# Patient Record
Sex: Female | Born: 1960 | Race: Black or African American | Hispanic: No | Marital: Married | Smoking: Never smoker
Health system: Southern US, Community
[De-identification: ages and names within clinical notes are randomized; demographics above are authoritative.]

---

## 2020-02-13 ENCOUNTER — Encounter (HOSPITAL_COMMUNITY): Payer: Self-pay

## 2020-02-13 ENCOUNTER — Other Ambulatory Visit: Payer: Self-pay

## 2020-02-13 ENCOUNTER — Emergency Department (HOSPITAL_COMMUNITY): Payer: BC Managed Care – PPO

## 2020-02-13 ENCOUNTER — Emergency Department (HOSPITAL_COMMUNITY)
Admission: EM | Admit: 2020-02-13 | Discharge: 2020-02-13 | Disposition: A | Discharge status: Home / Self Care | Payer: BC Managed Care – PPO | Attending: Emergency Medicine | Admitting: Emergency Medicine

## 2020-02-13 DIAGNOSIS — R059 Cough, unspecified: Secondary | ICD-10-CM | POA: Diagnosis not present

## 2020-02-13 DIAGNOSIS — Z20822 Contact with and (suspected) exposure to covid-19: Secondary | ICD-10-CM | POA: Diagnosis not present

## 2020-02-13 DIAGNOSIS — R52 Pain, unspecified: Secondary | ICD-10-CM

## 2020-02-13 LAB — RESPIRATORY PANEL BY RT PCR (FLU A&B, COVID)
Influenza A by PCR: NEGATIVE
Influenza B by PCR: NEGATIVE
SARS Coronavirus 2 by RT PCR: NEGATIVE

## 2020-02-13 MED ORDER — GUAIFENESIN 100 MG/5ML PO LIQD
100.0000 mg | ORAL | 0 refills | Status: AC | PRN
Start: 2020-02-13 — End: ?

## 2020-02-13 MED ORDER — BENZONATATE 100 MG PO CAPS: 100.0000 mg | ORAL_CAPSULE | Freq: Three times a day (TID) | ORAL | 0 refills | Status: AC

## 2020-02-13 NOTE — ED Triage Notes (Signed)
Pt presents with a cough for several days, increase cough last night that prevented her from sleeping.

## 2020-02-13 NOTE — ED Provider Notes (Signed)
[facility] EMERGENCY DEPARTMENT Provider Note   CSN: [id number] Arrival date & time: 02/13/20  1218     History Chief Complaint  Patient presents with  . Cough    Sophia Watts is a 59 y.o. female.  Patient presents the emergency department for evaluation of cough.  Patient states that she is from [place] and was admitted to the hospital several months ago with pneumonia.  At that time she quit smoking.  She has had a constant cough since that time, however this has worsened over the past 1 week.  Cough is dry and nonproductive.  She denies associated fevers, nasal congestion.  She has a scratchy itchy sensation in her throat.  No heartburn symptoms.  No chest pain or shortness of breath.  No nausea, vomiting, or abdominal pain.  She has been using throat lozenges with temporary relief.  She was having difficulty sleeping last night due to persistent coughing.  She has not tried other over-the-counter medications.       History reviewed. No pertinent past medical history.  There are no problems to display for this patient.   History reviewed. No pertinent surgical history.   OB History   No obstetric history on file.     History reviewed. No pertinent family history.  Social History   Tobacco Use  . Smoking status: Never Smoker  . Smokeless tobacco: Never Used  Substance Use Topics  . Alcohol use: Never  . Drug use: Never    Home Medications Prior to Admission medications   Medication Sig Start Date End Date Taking? Authorizing Provider  benzonatate (TESSALON) 100 MG capsule Take 1 capsule (100 mg total) by mouth every 8 (eight) hours. 02/13/20   Renne Crigler, PA-C  guaiFENesin (ROBITUSSIN) 100 MG/5ML liquid Take 5-10 mLs (100-200 mg total) by mouth every 4 (four) hours as needed for cough. 02/13/20   Renne Crigler, PA-C    Allergies    Patient has no known allergies.  Review of Systems   Review of Systems  Constitutional: Negative for  fever.  HENT: Negative for congestion, rhinorrhea and sore throat.   Eyes: Negative for redness.  Respiratory: Positive for cough. Negative for shortness of breath.   Cardiovascular: Negative for chest pain.  Gastrointestinal: Negative for abdominal pain, diarrhea, nausea and vomiting.  Genitourinary: Negative for dysuria, frequency, hematuria and urgency.  Musculoskeletal: Negative for myalgias.  Skin: Negative for rash.  Neurological: Negative for headaches.  Psychiatric/Behavioral: Positive for sleep disturbance.    Physical Exam Updated Vital Signs BP 113/72 (BP Location: Left Arm)   Pulse 81   Temp 98.5 F (36.9 C) (Oral)   Resp 16   SpO2 93%   Physical Exam Vitals and nursing note reviewed.  Constitutional:      General: She is not in acute distress.    Appearance: She is well-developed.  HENT:     Head: Normocephalic and atraumatic.     Right Ear: External ear normal.     Left Ear: External ear normal.     Nose: Nose normal.  Eyes:     Conjunctiva/sclera: Conjunctivae normal.  Cardiovascular:     Rate and Rhythm: Normal rate and regular rhythm.     Heart sounds: No murmur heard.   Pulmonary:     Effort: No respiratory distress.     Breath sounds: No wheezing, rhonchi or rales.     Comments: No coughing during exam. Abdominal:     Palpations: Abdomen is soft.  Tenderness: There is no abdominal tenderness. There is no guarding or rebound.  Musculoskeletal:     Cervical back: Normal range of motion and neck supple.     Right lower leg: No edema.     Left lower leg: No edema.  Skin:    General: Skin is warm and dry.     Findings: No rash.  Neurological:     General: No focal deficit present.     Mental Status: She is alert. Mental status is at baseline.     Motor: No weakness.  Psychiatric:        Mood and Affect: Mood normal.     ED Results / Procedures / Treatments   Labs (all labs ordered are listed, but only abnormal results are  displayed) Labs Reviewed  RESPIRATORY PANEL BY RT PCR (FLU A&B, COVID)    EKG None  Radiology DG Chest 1 View  Result Date: 02/13/2020 CLINICAL DATA:  Cough and chest pain for 1 day. EXAM: CHEST  1 VIEW COMPARISON:  None. FINDINGS: The cardiac silhouette, mediastinal and hilar contours are within normal limits. There is a partially calcified density noted in the left hilum which is likely a calcified lymph node. Correlation with any prior chest films would be helpful to document stability. If none are available I would recommend a follow-up two-view chest x-ray in 3-4 months to document stability. No infiltrates, edema or effusions. The bony thorax is intact. IMPRESSION: 1. Probable calcified left hilar lymph node. Correlation with any prior chest films would be helpful to document stability. If none are available, I would recommend follow-up two-view chest x-ray in 3-4 months. 2. No acute pulmonary findings. Electronically Signed   By: Rudie Meyer M.D.   On: 02/13/2020 13:40    Procedures Procedures (including critical care time)  Medications Ordered in ED Medications - No data to display  ED Course  I have reviewed the triage vital signs and the nursing notes.  Pertinent labs & imaging results that were available during my care of the patient were reviewed by me and considered in my medical decision making (see chart for details).  Patient seen and examined.  Chest x-ray ordered and reviewed.  Patient with calcified lymph node in the left lung.  I discussed this with the patient.  She states that she has been told in the past that her doctor said she had "a spot" on her lung but is not concerned about it.  I encouraged her to have this followed up by her doctor to ensure stability of this area.  She verbalizes understanding agrees with plan.  Low concern or suspicion for Covid, swab sent, given symptoms.  Patient will be given Robitussin and Tessalon to try to see if this helps  cough and allows her to sleep.  We discussed typical progression of cough after quitting smoking and that this should improve with time.  Vital signs reviewed and are as follows: BP 113/72 (BP Location: Left Arm)   Pulse 81   Temp 98.5 F (36.9 C) (Oral)   Resp 16   SpO2 93%   Encouraged primary care follow-up to evaluate for other causes of chronic cough.  Discussed with patient that she may try over-the-counter course of omeprazole if cough does not improve with suppressants.  Otherwise encouraged return to the emergency department if she develops severe shortness of breath, trouble breathing, chest pain, fever, new symptoms or other concerns.    MDM Rules/Calculators/A&P  Patient with acute on chronic coughing, causing difficulty sleeping.  Chest x-ray today without acute findings.  Covid test pending.  Vital signs are reassuring.  Patient looks well and in no respiratory distress.  Will give treatment with cough suppressants and have her follow-up with her doctor upon returning home to [place].   Final Clinical Impression(s) / ED Diagnoses Final diagnoses:  Cough    Rx / DC Orders ED Discharge Orders         Ordered    benzonatate (TESSALON) 100 MG capsule  Every 8 hours        02/13/20 1407    guaiFENesin (ROBITUSSIN) 100 MG/5ML liquid  Every 4 hours PRN        02/13/20 1407           Renne Crigler, PA-C 02/13/20 1421    Arby Barrette, MD 02/16/20 1423

## 2020-02-13 NOTE — Discharge Instructions (Signed)
Please read and follow all provided instructions.  Your diagnoses today include:  1. Cough   2. Pain     Tests performed today include:  Chest x-ray - does not show pneumonia, shows a possible calcified lymph node in the left lung.  If you have had this in the past and it is stable, it is not likely to be a problem.  Please let your doctor know about this so you can have it rechecked.  COVID testing - pending, you may check your results in MyChart  Vital signs. See below for your results today.   Medications prescribed:   Tessalon Perles - cough suppressant medication  Take any prescribed medications only as directed.  Home care instructions:  Follow any educational materials contained in this packet.  BE VERY CAREFUL not to take multiple medicines containing Tylenol (also called acetaminophen). Doing so can lead to an overdose which can damage your liver and cause liver failure and possibly death.   Follow-up instructions: Please follow-up with your primary care provider in the next 3 days for further evaluation of your symptoms.   Return instructions:   Please return to the Emergency Department if you experience worsening symptoms.   Return with worsening shortness of breath or trouble breathing, fever, persistent vomiting or other concerns.  Please return if you have any other emergent concerns.  Additional Information:  Your vital signs today were: BP 113/72 (BP Location: Left Arm)   Pulse 81   Temp 98.5 F (36.9 C) (Oral)   Resp 16   SpO2 93%  If your blood pressure (BP) was elevated above 135/85 this visit, please have this repeated by your doctor within one month. --------------

## 2021-10-13 IMAGING — CR DG CHEST 1V
1 series · 1 of 1 positions shown · non-contrast
Comparison: None.

CLINICAL DATA: Cough and chest pain for 1 day.

EXAM:
CHEST  1 VIEW

[chest pa]
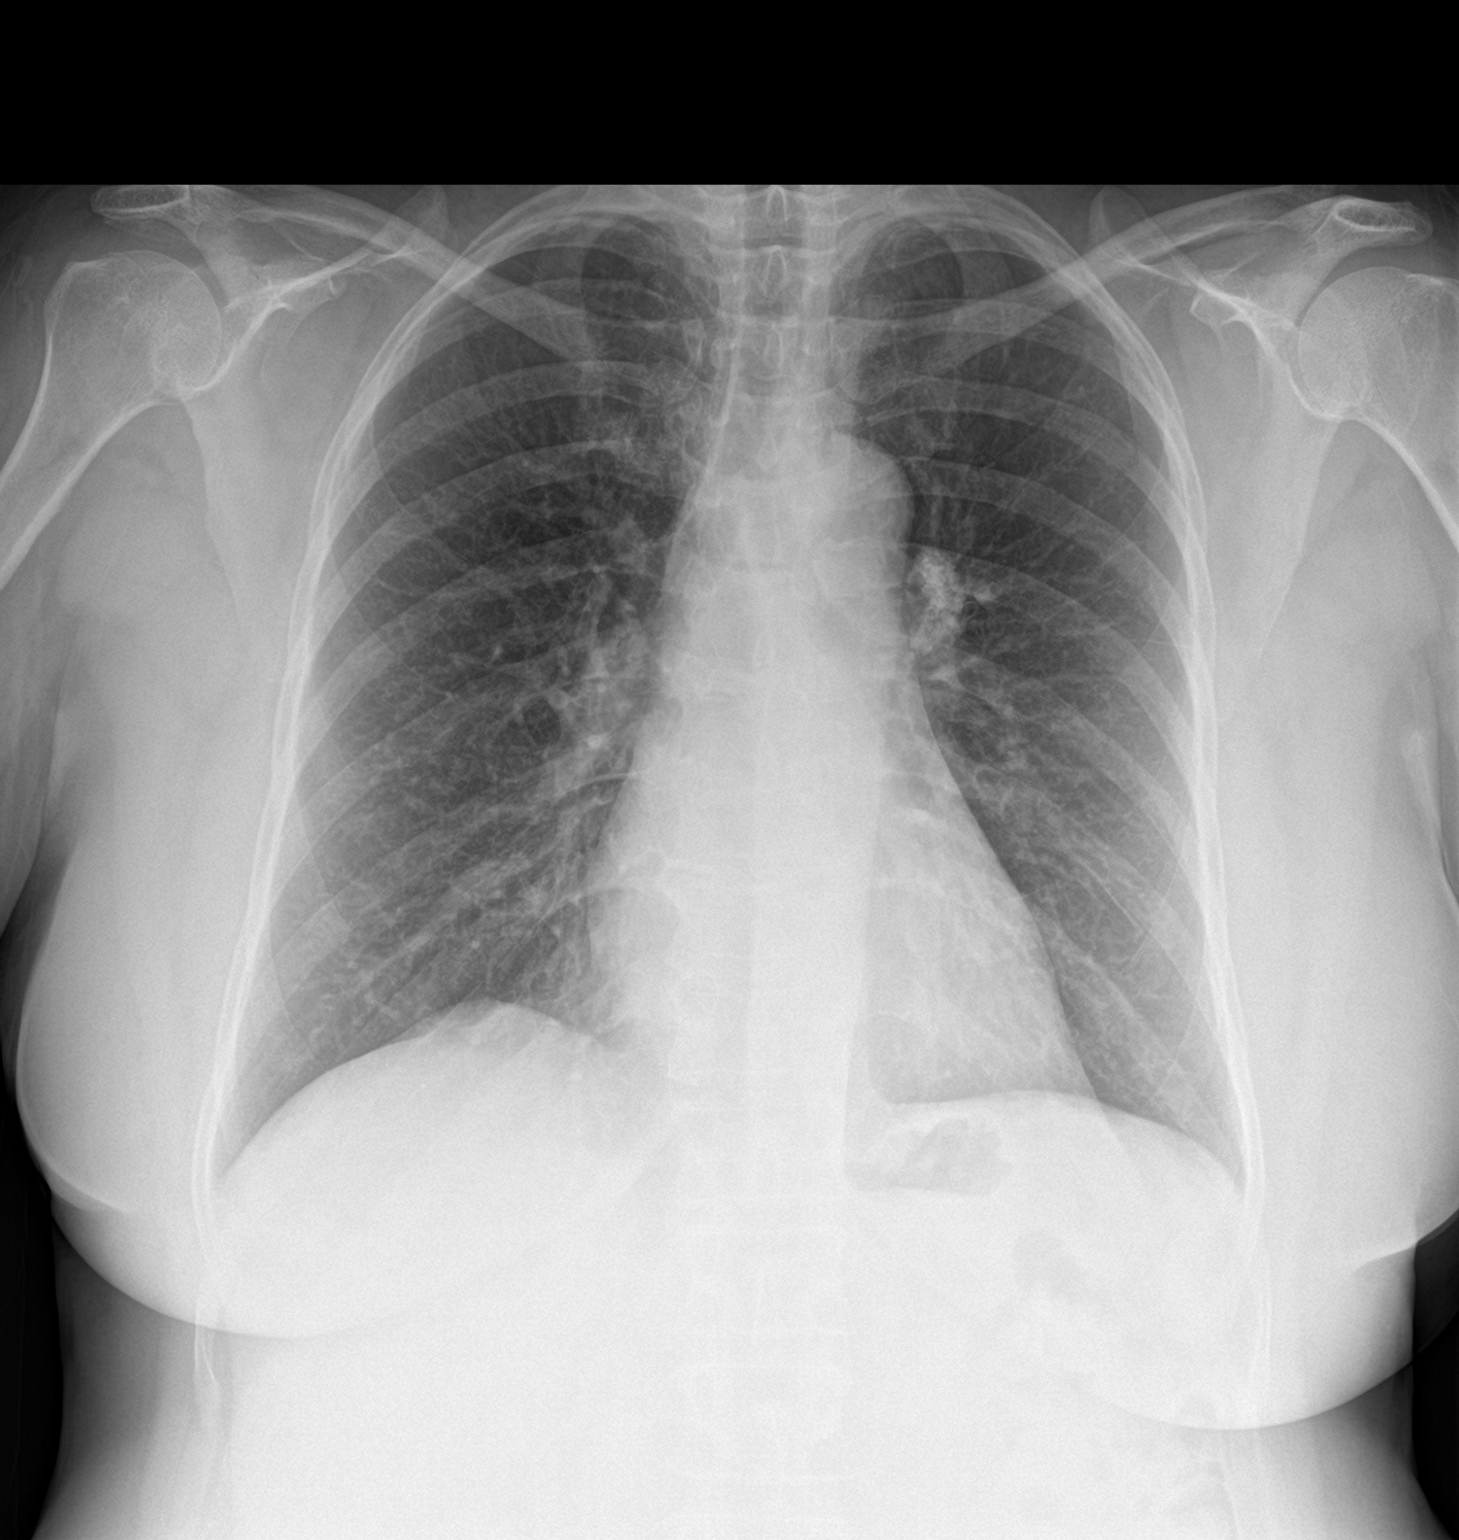

[1 of 1 positions shown; findings below may reference images not displayed]

FINDINGS: The cardiac silhouette, mediastinal and hilar contours are within
normal limits. There is a partially calcified density noted in the
left hilum which is likely a calcified lymph node. Correlation with
any prior chest films would be helpful to document stability. If
none are available I would recommend a follow-up two-view chest
x-ray in 3-4 months to document stability.

No infiltrates, edema or effusions.

The bony thorax is intact.
IMPRESSION: 1. Probable calcified left hilar lymph node. Correlation with any
prior chest films would be helpful to document stability. If none
are available, I would recommend follow-up two-view chest x-ray in
3-4 months.
2. No acute pulmonary findings.
